# Patient Record
Sex: Male | Born: 1995 | Hispanic: Yes | Marital: Single | Smoking: Never smoker
Health system: Southern US, Community
[De-identification: ages and names within clinical notes are randomized; demographics above are authoritative.]

---

## 2017-10-16 ENCOUNTER — Encounter (HOSPITAL_COMMUNITY): Payer: Self-pay

## 2017-10-16 ENCOUNTER — Emergency Department (HOSPITAL_COMMUNITY)
Admission: EM | Admit: 2017-10-16 | Discharge: 2017-10-16 | Disposition: A | Discharge status: Home / Self Care | Payer: Self-pay | Attending: Emergency Medicine | Admitting: Emergency Medicine

## 2017-10-16 DIAGNOSIS — Z5321 Procedure and treatment not carried out due to patient leaving prior to being seen by health care provider: Secondary | ICD-10-CM | POA: Insufficient documentation

## 2017-10-16 DIAGNOSIS — R42 Dizziness and giddiness: Secondary | ICD-10-CM | POA: Insufficient documentation

## 2017-10-16 LAB — COMPREHENSIVE METABOLIC PANEL
ALK PHOS: 56 U/L (ref 38–126)
ALT: 25 U/L (ref 17–63)
AST: 23 U/L (ref 15–41)
Albumin: 4.4 g/dL (ref 3.5–5.0)
Anion gap: 5 (ref 5–15)
BUN: 16 mg/dL (ref 6–20)
CALCIUM: 9 mg/dL (ref 8.9–10.3)
CHLORIDE: 109 mmol/L (ref 101–111)
CO2: 23 mmol/L (ref 22–32)
CREATININE: 0.77 mg/dL (ref 0.61–1.24)
Glucose, Bld: 92 mg/dL (ref 65–99)
Potassium: 4.1 mmol/L (ref 3.5–5.1)
Sodium: 137 mmol/L (ref 135–145)
Total Bilirubin: 0.6 mg/dL (ref 0.3–1.2)
Total Protein: 7.3 g/dL (ref 6.5–8.1)

## 2017-10-16 LAB — CBC
HCT: 45.2 % (ref 39.0–52.0)
Hemoglobin: 15.8 g/dL (ref 13.0–17.0)
MCH: 30.6 pg (ref 26.0–34.0)
MCHC: 35 g/dL (ref 30.0–36.0)
MCV: 87.6 fL (ref 78.0–100.0)
PLATELETS: 246 10*3/uL (ref 150–400)
RBC: 5.16 MIL/uL (ref 4.22–5.81)
RDW: 12.3 % (ref 11.5–15.5)
WBC: 6.8 10*3/uL (ref 4.0–10.5)

## 2017-10-16 LAB — LIPASE, BLOOD: LIPASE: 33 U/L (ref 11–51)

## 2017-10-16 NOTE — ED Triage Notes (Signed)
Pt complains of right sided pain for three days No vomiting or diarrhea but states that he's constipated

## 2017-10-16 NOTE — ED Notes (Signed)
No answer when called for recheck vitalsigns 

## 2018-04-19 ENCOUNTER — Encounter (HOSPITAL_COMMUNITY): Payer: Self-pay

## 2018-04-19 ENCOUNTER — Other Ambulatory Visit: Payer: Self-pay

## 2018-04-19 ENCOUNTER — Emergency Department (HOSPITAL_COMMUNITY): Payer: Self-pay

## 2018-04-19 DIAGNOSIS — Y93H3 Activity, building and construction: Secondary | ICD-10-CM | POA: Insufficient documentation

## 2018-04-19 DIAGNOSIS — Y929 Unspecified place or not applicable: Secondary | ICD-10-CM | POA: Insufficient documentation

## 2018-04-19 DIAGNOSIS — W450XXA Nail entering through skin, initial encounter: Secondary | ICD-10-CM | POA: Insufficient documentation

## 2018-04-19 DIAGNOSIS — Y999 Unspecified external cause status: Secondary | ICD-10-CM | POA: Insufficient documentation

## 2018-04-19 DIAGNOSIS — Z23 Encounter for immunization: Secondary | ICD-10-CM | POA: Insufficient documentation

## 2018-04-19 DIAGNOSIS — S61442A Puncture wound with foreign body of left hand, initial encounter: Secondary | ICD-10-CM | POA: Insufficient documentation

## 2018-04-19 MED ORDER — TETANUS-DIPHTH-ACELL PERTUSSIS 5-2.5-18.5 LF-MCG/0.5 IM SUSP
0.5000 mL | Freq: Once | INTRAMUSCULAR | Status: AC
  Administered 2018-04-20: 0.5 mL via INTRAMUSCULAR
  Filled 2018-04-19: qty 0.5

## 2018-04-19 NOTE — ED Triage Notes (Signed)
Patient with nail through left hand, +numbness to left hand, PMS intact. Last tetanus unknown.

## 2018-04-19 NOTE — ED Notes (Signed)
Pt tried to pull the nail in his hand out by himself while waiting in the lobby. Pt didn't succeed and blood squirted all over the lobby floor. Pt was only bleeding from the exit hole by the tip of the nail. Bleeding was controlled in lobby with coban and gauze.

## 2018-04-20 ENCOUNTER — Emergency Department (HOSPITAL_COMMUNITY)
Admission: EM | Admit: 2018-04-20 | Discharge: 2018-04-20 | Disposition: A | Discharge status: Home / Self Care | Payer: Self-pay | Attending: Emergency Medicine | Admitting: Emergency Medicine

## 2018-04-20 DIAGNOSIS — M795 Residual foreign body in soft tissue: Secondary | ICD-10-CM

## 2018-04-20 MED ORDER — LIDOCAINE-EPINEPHRINE (PF) 2 %-1:200000 IJ SOLN
10.0000 mL | Freq: Once | INTRAMUSCULAR | Status: AC
  Administered 2018-04-20: 10 mL
  Filled 2018-04-20: qty 20

## 2018-04-20 MED ORDER — CEPHALEXIN 500 MG PO CAPS: 500.0000 mg | ORAL_CAPSULE | Freq: Two times a day (BID) | ORAL | 0 refills | Status: DC

## 2018-04-20 MED ORDER — SULFAMETHOXAZOLE-TRIMETHOPRIM 800-160 MG PO TABS: 1.0000 | ORAL_TABLET | Freq: Two times a day (BID) | ORAL | 0 refills | Status: AC

## 2018-04-20 MED ORDER — BACITRACIN ZINC 500 UNIT/GM EX OINT
1.0000 "application " | TOPICAL_OINTMENT | Freq: Two times a day (BID) | CUTANEOUS | Status: DC
  Administered 2018-04-20: 1 via TOPICAL
  Filled 2018-04-20: qty 1.8

## 2018-04-20 NOTE — ED Provider Notes (Signed)
[facility]-EMERGENCY DEPT Provider Note   CSN: [id number] Arrival date & time: 04/19/18  2100     History   Chief Complaint Chief Complaint  Patient presents with  . Hand Injury  . Foreign Body in Skin    HPI Steven Day is a 22 y.o. male.  Patient presents to the emergency department with a chief complaint of foreign body in skin.  He was working on a remodeling project and hit a nail through the left hand.  Last tetanus shot is unknown.  He complains of moderate pain.  His symptoms are worsened with palpation and movement of the nail.  The history is provided by the patient. No language interpreter was used.    History reviewed. No pertinent past medical history.  There are no active problems to display for this patient.   History reviewed. No pertinent surgical history.      Home Medications    Prior to Admission medications   Not on File    Family History No family history on file.  Social History Social History   Tobacco Use  . Smoking status: Never Smoker  . Smokeless tobacco: Never Used  Substance Use Topics  . Alcohol use: No    Frequency: Never  . Drug use: No     Allergies   Patient has no allergy information on record.   Review of Systems Review of Systems  All other systems reviewed and are negative.    Physical Exam Updated Vital Signs BP (!) 146/101 (BP Location: Right Arm)   Pulse (!) 101   Temp 98 F (36.7 C) (Oral)   Resp 16   Ht 5\' 6"  (1.676 m)   Wt 85.2 kg (187 lb 14.4 oz)   SpO2 100%   BMI 30.33 kg/m   Physical Exam  Constitutional: He is oriented to person, place, and time. He appears well-developed and well-nourished.  HENT:  Head: Normocephalic and atraumatic.  Eyes: Conjunctivae and EOM are normal.  Neck: Normal range of motion.  Cardiovascular: Normal rate.  Pulmonary/Chest: Effort normal.  Abdominal: He exhibits no distension.  Musculoskeletal: Normal range of motion.  ROM and  strength of left thumb is 5/5 isolated at all joints  Neurological: He is alert and oriented to person, place, and time.  Skin: Skin is dry.  1 inch nail through the thenar eminence of the left palm  Psychiatric: He has a normal mood and affect. His behavior is normal. Judgment and thought content normal.  Nursing note and vitals reviewed.    ED Treatments / Results  Labs (all labs ordered are listed, but only abnormal results are displayed) Labs Reviewed - No data to display  EKG None  Radiology Dg Hand Complete Left  Result Date: 04/19/2018 CLINICAL DATA:  Nail and hand. EXAM: LEFT HAND - COMPLETE 3+ VIEW COMPARISON:  None. FINDINGS: Metallic nail with barbs within thenar eminence soft tissues. No fracture deformity or dislocation. No destructive bony lesions. Bandage about the hand. IMPRESSION: Nail within the thenar eminence soft tissues. No acute osseous process. Electronically Signed   By: Awilda Metroourtnay  Bloomer M.D.   On: 04/19/2018 23:23    Procedures .Foreign Body Removal Date/Time: 04/20/2018 1:30 AM Performed by: Roxy HorsemanBrowning, Raye Slyter, PA-C Authorized by: Roxy HorsemanBrowning, Kelsey Edman, PA-C  Consent: Verbal consent obtained. Risks and benefits: risks, benefits and alternatives were discussed Consent given by: patient Patient understanding: patient states understanding of the procedure being performed Patient consent: the patient's understanding of the procedure matches consent given Procedure  consent: procedure consent matches procedure scheduled Relevant documents: relevant documents present and verified Test results: test results available and properly labeled Site marked: the operative site was marked Imaging studies: imaging studies available Required items: required blood products, implants, devices, and special equipment available Patient identity confirmed: verbally with patient Time out: Immediately prior to procedure a "time out" was called to verify the correct patient,  procedure, equipment, support staff and site/side marked as required. Body area: skin General location: upper extremity Location details: left hand  Anesthesia: Local Anesthetic: lidocaine 1% with epinephrine Anesthetic total: 3 mL  Sedation: Patient sedated: no  Patient restrained: no Patient cooperative: yes Localization method: visualized Removal mechanism: hemostat and scalpel Dressing: antibiotic ointment and dressing applied Depth: subcutaneous Complexity: simple 1 objects recovered. Objects recovered: nail Post-procedure assessment: foreign body removed Patient tolerance: Patient tolerated the procedure well with no immediate complications   (including critical care time)  Medications Ordered in ED Medications  Tdap (BOOSTRIX) injection 0.5 mL (has no administration in time range)  lidocaine-EPINEPHrine (XYLOCAINE W/EPI) 2 %-1:200000 (PF) injection 10 mL (has no administration in time range)  bacitracin ointment 1 application (has no administration in time range)     Initial Impression / Assessment and Plan / ED Course  I have reviewed the triage vital signs and the nursing notes.  Pertinent labs & imaging results that were available during my care of the patient were reviewed by me and considered in my medical decision making (see chart for details).     Patient with a 1 inch nail through his left palm.  Removed in the ED.  Tetanus shot updated.  Packing was placed in the puncture wound to allow for draining.  Discharged with antibiotics.  Final Clinical Impressions(s) / ED Diagnoses   Final diagnoses:  Foreign body (FB) in soft tissue    ED Discharge Orders        Ordered    sulfamethoxazole-trimethoprim (BACTRIM DS,SEPTRA DS) 800-160 MG tablet  2 times daily     04/20/18 0132    cephALEXin (KEFLEX) 500 MG capsule  2 times daily     04/20/18 0132       Roxy Horseman, PA-C 04/20/18 0133    Glynn Octave, MD 04/20/18 [id number]

## 2018-05-05 ENCOUNTER — Emergency Department (HOSPITAL_COMMUNITY)
Admission: EM | Admit: 2018-05-05 | Discharge: 2018-05-06 | Payer: Self-pay | Attending: Emergency Medicine | Admitting: Emergency Medicine

## 2018-05-05 ENCOUNTER — Emergency Department (HOSPITAL_COMMUNITY): Payer: Self-pay

## 2018-05-05 DIAGNOSIS — F1510 Other stimulant abuse, uncomplicated: Secondary | ICD-10-CM | POA: Insufficient documentation

## 2018-05-05 LAB — RAPID URINE DRUG SCREEN, HOSP PERFORMED
AMPHETAMINES: POSITIVE — AB
BENZODIAZEPINES: NOT DETECTED
Cocaine: NOT DETECTED
OPIATES: NOT DETECTED
Tetrahydrocannabinol: NOT DETECTED

## 2018-05-05 LAB — COMPREHENSIVE METABOLIC PANEL
ALBUMIN: 4.1 g/dL (ref 3.5–5.0)
ALK PHOS: 60 U/L (ref 38–126)
ALT: 25 U/L (ref 0–44)
ANION GAP: 9 (ref 5–15)
AST: 26 U/L (ref 15–41)
BILIRUBIN TOTAL: 1 mg/dL (ref 0.3–1.2)
BUN: 17 mg/dL (ref 6–20)
CALCIUM: 8.6 mg/dL — AB (ref 8.9–10.3)
CO2: 24 mmol/L (ref 22–32)
Chloride: 106 mmol/L (ref 98–111)
Creatinine, Ser: 0.94 mg/dL (ref 0.61–1.24)
Glucose, Bld: 93 mg/dL (ref 70–99)
Potassium: 3.4 mmol/L — ABNORMAL LOW (ref 3.5–5.1)
Sodium: 139 mmol/L (ref 135–145)
TOTAL PROTEIN: 7 g/dL (ref 6.5–8.1)

## 2018-05-05 LAB — CBG MONITORING, ED: GLUCOSE-CAPILLARY: 86 mg/dL (ref 70–99)

## 2018-05-05 LAB — CBC WITH DIFFERENTIAL/PLATELET
BASOS ABS: 0 10*3/uL (ref 0.0–0.1)
BASOS PCT: 0 %
Eosinophils Absolute: 0.6 10*3/uL (ref 0.0–0.7)
Eosinophils Relative: 10 %
HEMATOCRIT: 42.8 % (ref 39.0–52.0)
Hemoglobin: 15 g/dL (ref 13.0–17.0)
Lymphocytes Relative: 27 %
Lymphs Abs: 1.5 10*3/uL (ref 0.7–4.0)
MCH: 30.1 pg (ref 26.0–34.0)
MCHC: 35 g/dL (ref 30.0–36.0)
MCV: 85.8 fL (ref 78.0–100.0)
Monocytes Absolute: 0.7 10*3/uL (ref 0.1–1.0)
Monocytes Relative: 12 %
Neutro Abs: 2.9 10*3/uL (ref 1.7–7.7)
Neutrophils Relative %: 51 %
Platelets: 219 10*3/uL (ref 150–400)
RBC: 4.99 MIL/uL (ref 4.22–5.81)
RDW: 12.3 % (ref 11.5–15.5)
WBC: 5.7 10*3/uL (ref 4.0–10.5)

## 2018-05-05 LAB — ETHANOL

## 2018-05-05 LAB — I-STAT TROPONIN, ED: TROPONIN I, POC: 0 ng/mL (ref 0.00–0.08)

## 2018-05-05 LAB — CK: Total CK: 416 U/L — ABNORMAL HIGH (ref 49–397)

## 2018-05-05 LAB — ACETAMINOPHEN LEVEL

## 2018-05-05 LAB — I-STAT CG4 LACTIC ACID, ED: Lactic Acid, Venous: 0.91 mmol/L (ref 0.5–1.9)

## 2018-05-05 LAB — SALICYLATE LEVEL: Salicylate Lvl: <7 mg/dL (ref 2.8–30.0)

## 2018-05-05 MED ORDER — SODIUM CHLORIDE 0.9 % IV BOLUS
1000.0000 mL | Freq: Once | INTRAVENOUS | Status: AC
  Administered 2018-05-05: 1000 mL via INTRAVENOUS

## 2018-05-05 NOTE — ED Triage Notes (Signed)
Per ems: Pt in was in back of [employer] car being transported to jail.  Pt became diaphoretic and had near syncope.  When EMS arrived he stated he felt weak. Pt didn't admit to drug use, but drug paraphernalia and meth was found in his bag. 12 lead unremarkable.  After 450 mL of fluid pt states he felt better.

## 2018-05-05 NOTE — ED Provider Notes (Signed)
[facility]-EMERGENCY DEPT Provider Note   CSN: [id number] Arrival date & time: 05/05/18  1851     History   Chief Complaint Chief Complaint  Patient presents with  . Near Syncope    HPI Steven Day is a 22 y.o. male brought in by police for evaluation of near syncope.  Please state that they were called out to his house by patient's mother who stated that he was "acting weird like he was on drugs or something."  Please state that they found patient in the woods dressed in head to toe camo gear and was digging a hole in the ground.  They stated that patient was acting weird and was talking very fast.  They found methamphetamine and a smoking pipe on patient.  They were taking patient back to the house and were deciding if he need to be evaluated by psych.  Police reports that when they first saw him he was very diaphoretic.  They brought him in the house where he was drinking water and seemed to have calm down.  She was placed in police custody and states that when he got in the backseat of the car, he became very diaphoretic and appeared to be near syncopal.  They states patient did not lose consciousness.  Mom states no previous psych history but states that he will intermittently have these bursts where he will start acting funny.  Patient states that he took methamphetamine earlier today.  He denies any cocaine or heroin use.  He states he has not been drinking any alcohol.  Pain reports pain to his arms and legs.  She denies chest pain, abdominal pain, vomiting.  The history is provided by the patient and the police. A language interpreter was used.    No past medical history on file.  There are no active problems to display for this patient.   No past surgical history on file.      Home Medications    Prior to Admission medications   Medication Sig Start Date End Date Taking? Authorizing Provider  cephALEXin (KEFLEX) 500 MG capsule Take 1 capsule (500  mg total) by mouth 2 (two) times daily. 04/20/18   Roxy Horseman, PA-C    Family History No family history on file.  Social History Social History   Tobacco Use  . Smoking status: Never Smoker  . Smokeless tobacco: Never Used  Substance Use Topics  . Alcohol use: No    Frequency: Never  . Drug use: No     Allergies   Patient has no allergy information on record.   Review of Systems Review of Systems  Constitutional: Positive for diaphoresis. Negative for fever.  Respiratory: Negative for cough and shortness of breath.   Cardiovascular: Negative for chest pain.  Gastrointestinal: Negative for abdominal pain, nausea and vomiting.  Musculoskeletal: Positive for myalgias.  Neurological: Negative for headaches.       Near syncope  All other systems reviewed and are negative.    Physical Exam Updated Vital Signs BP 119/73 (BP Location: Left Arm)   Pulse 77   Temp 97.6 F (36.4 C)   Resp 18   SpO2 97%   Physical Exam  Constitutional: He is oriented to person, place, and time. He appears well-developed and well-nourished.  HENT:  Head: Normocephalic and atraumatic.  Mouth/Throat: Oropharynx is clear and moist and mucous membranes are normal.  No tenderness to palpation of skull. No deformities or crepitus noted. No open wounds, abrasions or  lacerations.   Eyes: Pupils are equal, round, and reactive to light. Conjunctivae, EOM and lids are normal.  No mydriasis   Neck: Full passive range of motion without pain.  Cardiovascular: Normal rate, regular rhythm, normal heart sounds and normal pulses. Exam reveals no gallop and no friction rub.  No murmur heard. Pulses:      Radial pulses are 2+ on the right side, and 2+ on the left side.       Dorsalis pedis pulses are 2+ on the right side, and 2+ on the left side.  Pulmonary/Chest: Effort normal and breath sounds normal.  Lungs clear to auscultation bilaterally.  Symmetric chest rise.  No wheezing, rales, rhonchi.    Abdominal: Soft. Normal appearance. There is no tenderness. There is no rigidity and no guarding.  Abdomen is soft, non-distended, non-tender. No rigidity, No guarding. No peritoneal signs.  Musculoskeletal: Normal range of motion.  No tenderness to palpation to bilateral shoulders, clavicles, elbows, and wrists. No deformities or crepitus noted. FROM of BUE without difficulty.  No tenderness to palpation to bilateral knees and ankles. No deformities or crepitus noted. FROM of BLE without any difficulty.   Neurological: He is alert and oriented to person, place, and time.  Alert and oriented x3. Intermittently follows commands, Moves all extremities.  Patient is slow to respond but answers questions appropriately 5/5 strength to BUE and BLE   Skin: Skin is warm and dry. Capillary refill takes less than 2 seconds. He is not diaphoretic. No pallor.  Psychiatric: He has a normal mood and affect. His speech is normal.  Nursing note and vitals reviewed.    ED Treatments / Results  Labs (all labs ordered are listed, but only abnormal results are displayed) Labs Reviewed  COMPREHENSIVE METABOLIC PANEL - Abnormal; Notable for the following components:      Result Value   Potassium 3.4 (*)    Calcium 8.6 (*)    All other components within normal limits  CK - Abnormal; Notable for the following components:   Total CK 416 (*)    All other components within normal limits  RAPID URINE DRUG SCREEN, HOSP PERFORMED - Abnormal; Notable for the following components:   Amphetamines POSITIVE (*)    Barbiturates   (*)    Value: Result not available. Reagent lot number recalled by manufacturer.   All other components within normal limits  ACETAMINOPHEN LEVEL - Abnormal; Notable for the following components:   Acetaminophen (Tylenol), Serum <10 (*)    All other components within normal limits  CBC WITH DIFFERENTIAL/PLATELET  SALICYLATE LEVEL  ETHANOL  I-STAT TROPONIN, ED  I-STAT CG4 LACTIC ACID, ED   CBG MONITORING, ED  I-STAT CG4 LACTIC ACID, ED    EKG EKG Interpretation  Date/Time:  Wednesday May 05 2018 19:22:47 EDT Ventricular Rate:  78 PR Interval:    QRS Duration: 95 QT Interval:  385 QTC Calculation: 439 R Axis:   -3 Text Interpretation:  Age not entered, assumed to be  22 years old for purpose of ECG interpretation Sinus rhythm No previous ECGs available Confirmed by Richardean Canal [phone]) on 05/05/2018 8:34:06 PM   Radiology Ct Head Wo Contrast  Result Date: 05/05/2018 CLINICAL DATA:  Near syncopal episode EXAM: CT HEAD WITHOUT CONTRAST TECHNIQUE: Contiguous axial images were obtained from the base of the skull through the vertex without intravenous contrast. COMPARISON:  None. FINDINGS: Brain: No evidence of acute infarction, hemorrhage, hydrocephalus, extra-axial collection or mass lesion/mass effect. Vascular: No hyperdense  vessel or unexpected calcification. Skull: Normal. Negative for fracture or focal lesion. Sinuses/Orbits: No acute finding. Mild mucosal thickening in the ethmoid sinuses Other: None IMPRESSION: Negative non contrasted CT appearance of the brain Electronically Signed   By: Jasmine PangKim  Fujinaga M.D.   On: 05/05/2018 22:04    Procedures Procedures (including critical care time)  Medications Ordered in ED Medications  sodium chloride 0.9 % bolus 1,000 mL (0 mLs Intravenous Stopped 05/05/18 2055)     Initial Impression / Assessment and Plan / ED Course  I have reviewed the triage vital signs and the nursing notes.  Pertinent labs & imaging results that were available during my care of the patient were reviewed by me and considered in my medical decision making (see chart for details).     22 year old male brought in by police for evaluation of abnormal behavior near syncope.  Initially called out to mom's house for abnormal behavior.  At that time, please found meth and smoking pipe on the patient.  They report he was diaphoretic initially.  When they  brought him into the police car, he again the got diaphoretic and appeared to have a near syncopal event.  No LOC. Patient is afebrile. Patient is slow to respond but answers questions appropriately.  Intermittently will follow commands but has to be prompted.  Using interpreter, patient states that he took meth but denies any other drug use.  Complains of bilateral leg and arm pain.  No chest pain, difficulty breathing, abdominal pain.  Plan to check basic labs, EKG, UDS, salicylate acetaminophen, ethanol.  Will plan to give IV fluids.   I-STAT lactic acid is negative.  Troponin negative.  Ethanol unremarkable.  Salicylate level unremarkable.  Acetaminophen level unremarkable.  CK is 416.  CBC without any significant leukocytosis, anemia.  CMP shows no elevation in BUN or creatinine.  Potassium is 3.4.  Otherwise unremarkable.  UDS is positive for meth.  CT head negative for any acute abnormalities.  Reevaluation.  Patient responds to verbal stimuli.  He only speaks Spanish she has difficulty responding to me but is moving all extremities without any difficulty.  Appears in no acute distress.  Vital signs are stable.  CK was slightly elevated at 416 but not elevated enough to concern for rhabdomyolysis.  Patient received 1 L bolus of fluid here in the ED.  Vital signs are stable.  Patient is being disposed to jail.  Patient stable for discharge at this time.  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

## 2020-12-24 ENCOUNTER — Emergency Department (HOSPITAL_COMMUNITY): Payer: Self-pay

## 2020-12-24 ENCOUNTER — Emergency Department (HOSPITAL_COMMUNITY)
Admission: EM | Admit: 2020-12-24 | Discharge: 2020-12-24 | Disposition: A | Discharge status: Home / Self Care | Payer: Self-pay | Attending: Emergency Medicine | Admitting: Emergency Medicine

## 2020-12-24 ENCOUNTER — Other Ambulatory Visit: Payer: Self-pay

## 2020-12-24 DIAGNOSIS — W1839XA Other fall on same level, initial encounter: Secondary | ICD-10-CM | POA: Insufficient documentation

## 2020-12-24 DIAGNOSIS — S43005A Unspecified dislocation of left shoulder joint, initial encounter: Secondary | ICD-10-CM | POA: Insufficient documentation

## 2020-12-24 DIAGNOSIS — Y92007 Garden or yard of unspecified non-institutional (private) residence as the place of occurrence of the external cause: Secondary | ICD-10-CM | POA: Insufficient documentation

## 2020-12-24 MED ORDER — ONDANSETRON HCL 4 MG/2ML IJ SOLN
4.0000 mg | Freq: Once | INTRAMUSCULAR | Status: AC
  Administered 2020-12-24: 4 mg via INTRAVENOUS
  Filled 2020-12-24: qty 2

## 2020-12-24 MED ORDER — LIDOCAINE HCL 2 % IJ SOLN
10.0000 mL | Freq: Once | INTRAMUSCULAR | Status: AC
  Administered 2020-12-24: 200 mg via INTRADERMAL
  Filled 2020-12-24: qty 20

## 2020-12-24 MED ORDER — FENTANYL CITRATE (PF) 100 MCG/2ML IJ SOLN
50.0000 ug | INTRAMUSCULAR | Status: DC | PRN
  Administered 2020-12-24: 50 ug via NASAL
  Filled 2020-12-24: qty 2

## 2020-12-24 MED ORDER — DIAZEPAM 5 MG/ML IJ SOLN
5.0000 mg | Freq: Once | INTRAMUSCULAR | Status: AC
  Administered 2020-12-24: 5 mg via INTRAVENOUS
  Filled 2020-12-24: qty 2

## 2020-12-24 MED ORDER — HYDROMORPHONE HCL 1 MG/ML IJ SOLN: 1.0000 mg | Freq: Once | INTRAMUSCULAR | Status: DC

## 2020-12-24 MED ORDER — FENTANYL CITRATE (PF) 100 MCG/2ML IJ SOLN
100.0000 ug | Freq: Once | INTRAMUSCULAR | Status: DC
  Administered 2020-12-24: 100 ug via INTRAVENOUS
  Filled 2020-12-24: qty 2

## 2020-12-24 NOTE — ED Triage Notes (Signed)
t arrives to triage in 10/10 pt with left shoulder injury fell in his yard just PTA. Placed in sling, will get xray and inform charge of need for further pain meds in back

## 2020-12-24 NOTE — Progress Notes (Signed)
Orthopedic Tech Progress Note Patient Details:  Steven Day Oct 18, 1996 [id number]  Ortho Devices Type of Ortho Device: Shoulder immobilizer Ortho Device/Splint Location: Left Upper Extremity Ortho Device/Splint Interventions: Ordered,Application,Adjustment   Post Interventions Patient Tolerated: Well Instructions Provided: Adjustment of device,Care of device   Gerald Stabs 12/24/2020, 6:50 PM

## 2020-12-24 NOTE — ED Provider Notes (Addendum)
[facility] EMERGENCY DEPARTMENT Provider Note   CSN: [id number] Arrival date & time: 12/24/20  1713     History Chief Complaint  Patient presents with   Shoulder Injury    Steven Day is a 25 y.o. male presents for evaluation of left shoulder pain after mechanical fall that happened 2 hours prior to arrival.  Patient reports that he was at home and states he fell and thinks he may have landed on his left shoulder.  Since then, he has had pain, difficulty moving.  He states he did not have any other injury.  He denies any numbness/weakness, neck pain.  The history is provided by the patient.       No past medical history on file.  There are no problems to display for this patient.   No past surgical history on file.     No family history on file.  Social History   Tobacco Use   Smoking status: Never Smoker   Smokeless tobacco: Never Used  Substance Use Topics   Alcohol use: No   Drug use: No    Home Medications Prior to Admission medications   Medication Sig Start Date End Date Taking? Authorizing Provider  cephALEXin (KEFLEX) 500 MG capsule Take 1 capsule (500 mg total) by mouth 2 (two) times daily. 04/20/18   Roxy Horseman, PA-C    Allergies    Patient has no known allergies.  Review of Systems   Review of Systems  Musculoskeletal:       Right shoulder pain  Neurological: Negative for weakness and numbness.  All other systems reviewed and are negative.   Physical Exam Updated Vital Signs BP (!) 131/91    Pulse 92    Resp (!) 21    SpO2 95%   Physical Exam Vitals and nursing note reviewed.  Constitutional:      Appearance: He is well-developed and well-nourished.  HENT:     Head: Normocephalic and atraumatic.  Eyes:     General: No scleral icterus.       Right eye: No discharge.        Left eye: No discharge.     Extraocular Movements: EOM normal.     Conjunctiva/sclera: Conjunctivae normal.  Cardiovascular:      Pulses:          Radial pulses are 2+ on the right side and 2+ on the left side.  Pulmonary:     Effort: Pulmonary effort is normal.  Musculoskeletal:     Comments: Tenderness palpation noted to the left shoulder with deformity noted.  Limited range of motion secondary pain.  No bony tenderness of the left elbow, left forearm.  Skin:    General: Skin is warm and dry.     Comments: Good distal cap refill. LUE is not dusky in appearance or cool to touch.  Neurological:     Mental Status: He is alert.  Psychiatric:        Mood and Affect: Mood and affect normal.        Speech: Speech normal.        Behavior: Behavior normal.     ED Results / Procedures / Treatments   Labs (all labs ordered are listed, but only abnormal results are displayed) Labs Reviewed - No data to display  EKG None  Radiology DG Shoulder Left  Result Date: 12/24/2020 CLINICAL DATA:  25 year old male with left shoulder pain. EXAM: LEFT SHOULDER - 2+ VIEW COMPARISON:  None FINDINGS: Evaluation  is limited on this single provided view. There is dislocation of the shoulder. The humeral head appears inferior and likely anterior to the glenoid. Ill-defined irregularity of the humeral head may represent a Hill-Sachs injury. The soft tissues are unremarkable. IMPRESSION: Dislocated left shoulder. Electronically Signed   By: Elgie Collard M.D.   On: 12/24/2020 18:47   DG Shoulder Left Port  Result Date: 12/24/2020 CLINICAL DATA:  Status post reduction for anterior dislocation EXAM: LEFT SHOULDER COMPARISON:  December 24, 2020 study obtained earlier in the day FINDINGS: Frontal and oblique views obtained. There has been successful reduction of anterior dislocation. There is an apparent impaction type injury along the lateral humeral head. No other evident fracture. Joint spaces appear normal. Visualized left lung clear. IMPRESSION: Successful reduction of previous anterior dislocation. Apparent impaction type injury along  the lateral humeral head. No other evident fracture. No evident arthropathy. Electronically Signed   By: Bretta Bang III M.D.   On: 12/24/2020 19:30    Procedures Reduction of dislocation  Date/Time: 12/24/2020 8:01 PM Performed by: Maxwell Caul, PA-C Authorized by: Maxwell Caul, PA-C  Consent: Verbal consent obtained. Risks and benefits: risks, benefits and alternatives were discussed Consent given by: patient Patient understanding: patient states understanding of the procedure being performed Patient consent: the patient's understanding of the procedure matches consent given Procedure consent: procedure consent matches procedure scheduled Relevant documents: relevant documents present and verified Test results: test results available and properly labeled Site marked: the operative site was marked Required items: required blood products, implants, devices, and special equipment available Patient identity confirmed: verbally with patient Time out: Immediately prior to procedure a "time out" was called to verify the correct patient, procedure, equipment, support staff and site/side marked as required. Local anesthesia used: no  Anesthesia: Local anesthesia used: no  Sedation: Patient sedated: no  Patient tolerance: patient tolerated the procedure well with no immediate complications      Medications Ordered in ED Medications  fentaNYL (SUBLIMAZE) injection 50 mcg (50 mcg Nasal Given 12/24/20 1750)  ondansetron (ZOFRAN) injection 4 mg (4 mg Intravenous Given 12/24/20 1823)  lidocaine (XYLOCAINE) 2 % (with pres) injection 200 mg (200 mg Intradermal Given by Other 12/24/20 1840)  diazepam (VALIUM) injection 5 mg (5 mg Intravenous Given 12/24/20 1840)    ED Course  I have reviewed the triage vital signs and the nursing notes.  Pertinent labs & imaging results that were available during my care of the patient were reviewed by me and considered in my medical decision  making (see chart for details).    MDM Rules/Calculators/A&P                          25 y.o. M who presents for evaluation of left shoulder pain after a mechanical fall that occurred 2 hours prior to arrival. Patient is afebrile, non-toxic appearing.  He appears uncomfortable, in pain.  Vitals show that he is slightly tachycardic, hypertensive.  Likely secondary to pain.  On exam, right shoulder appears to be deformed, concern for dislocation versus fracture.  He is neurovascularly intact.  X-rays ordered at triage.  X-ray shows shoulder dislocation.  Reduction done at bedside with successful reduction.  Repeat x-ray shows that shoulder is back in place.  Patient placed in a shoulder immobilizer.  I discussed with patient with the Spanish interpreter.  Instructed to keep the shoulder immobilizer on until he follows up with orthopedics.  Will give outpatient orthopedics  for follow-up. At this time, patient exhibits no emergent life-threatening condition that require further evaluation in ED. Patient had ample opportunity for questions and discussion. All patient's questions were answered with full understanding. Strict return precautions discussed. Patient expresses understanding and agreement to plan.   Portions of this note were generated with Scientist, clinical (histocompatibility and immunogenetics). Dictation errors may occur despite best attempts at proofreading.   Final Clinical Impression(s) / ED Diagnoses Final diagnoses:  Dislocation of left shoulder joint, initial encounter    Rx / DC Orders ED Discharge Orders    None       Rosana Hoes 12/24/20 2003    Rosana Hoes 12/25/20 1509    Charlynne Pander, MD 12/25/20 [phone]

## 2020-12-24 NOTE — ED Notes (Signed)
Shoulder immobilizer in place at time of discharge.

## 2020-12-24 NOTE — Discharge Instructions (Signed)
Wear the sling immobilizer until you follow up with ortho.   You can take Tylenol or Ibuprofen as directed for pain. You can alternate Tylenol and Ibuprofen every 4 hours. If you take Tylenol at 1pm, then you can take Ibuprofen at 5pm. Then you can take Tylenol again at 9pm.   Call ortho and arrange for an appointment.   Return to the Emergency Dept for any worsening pain, numbness/weakness or any other worsening or concerning symptoms.

## 2020-12-24 NOTE — Medical Student Note (Signed)
[facility]-EMERGENCY DEPT Provider Student Note For educational purposes for Medical, PA and NP students only and not part of the legal medical record.   CSN: [id number] Arrival date & time: 12/24/20  1713      History   Chief Complaint Chief Complaint  Patient presents with  . Shoulder Injury    HPI Steven Day is a 25 y.o. male who presents to ED with acute left shoulder dislocation.   States that about two hours PTA he was "playing around the house" when he fell and landed on his left shoulder. He immediately had pain and deformity and presented to the ED with significant other. He states he is in 10/10 pain. Denies hitting head, LOC or any other pain.   No past medical history on file.  There are no problems to display for this patient.   No past surgical history on file.   Home Medications    Prior to Admission medications   Medication Sig Start Date End Date Taking? Authorizing Provider  cephALEXin (KEFLEX) 500 MG capsule Take 1 capsule (500 mg total) by mouth 2 (two) times daily. 04/20/18   Roxy Horseman, PA-C    Family History No family history on file.  Social History Social History   Tobacco Use  . Smoking status: Never Smoker  . Smokeless tobacco: Never Used  Substance Use Topics  . Alcohol use: No  . Drug use: No     Allergies   Patient has no known allergies.   Review of Systems Review of Systems  Constitutional: Positive for diaphoresis.  HENT: Negative.   Eyes: Negative.   Respiratory: Negative.   Cardiovascular: Negative.   Gastrointestinal: Negative.   Endocrine: Negative.   Genitourinary: Negative.   Musculoskeletal: Positive for arthralgias.  Skin: Negative.   Allergic/Immunologic: Negative.   Neurological: Negative.   Hematological: Negative.   Psychiatric/Behavioral: Negative.      Physical Exam Updated Vital Signs BP (!) 137/98   Pulse 90   Resp 13   SpO2 94%   Physical Exam Vitals and nursing note reviewed.   Constitutional:      General: He is in acute distress.     Appearance: He is diaphoretic.  HENT:     Head: Normocephalic and atraumatic.  Cardiovascular:     Pulses: Normal pulses.  Musculoskeletal:        General: Tenderness, deformity and signs of injury present.     Right shoulder: Normal.     Left shoulder: Deformity, tenderness and bony tenderness present. Decreased range of motion.     Cervical back: Normal range of motion and neck supple. No tenderness.  Skin:    General: Skin is warm.     Capillary Refill: Capillary refill takes less than 2 seconds.     Findings: No bruising.  Neurological:     General: No focal deficit present.     Mental Status: He is alert.     Sensory: No sensory deficit.    ED Treatments / Results  Labs (all labs ordered are listed, but only abnormal results are displayed) Labs Reviewed - No data to display  EKG  Radiology DG Shoulder Left  Result Date: 12/24/2020 CLINICAL DATA:  24 year old male with left shoulder pain. EXAM: LEFT SHOULDER - 2+ VIEW COMPARISON:  None FINDINGS: Evaluation is limited on this single provided view. There is dislocation of the shoulder. The humeral head appears inferior and likely anterior to the glenoid. Ill-defined irregularity of the humeral head may represent a Hill-Sachs  injury. The soft tissues are unremarkable. IMPRESSION: Dislocated left shoulder. Electronically Signed   By: Elgie Collard M.D.   On: 12/24/2020 18:47    Procedures Procedures (including critical care time)  Medications Ordered in ED Medications  fentaNYL (SUBLIMAZE) injection 50 mcg (50 mcg Nasal Given 12/24/20 1750)  ondansetron (ZOFRAN) injection 4 mg (4 mg Intravenous Given 12/24/20 1823)  lidocaine (XYLOCAINE) 2 % (with pres) injection 200 mg (200 mg Intradermal Given by Other 12/24/20 1840)  diazepam (VALIUM) injection 5 mg (5 mg Intravenous Given 12/24/20 1840)   Initial Impression / Assessment and Plan / ED Course  I have reviewed  the triage vital signs and the nursing notes.  Pertinent labs & imaging results that were available during my care of the patient were reviewed by me and considered in my medical decision making (see chart for details).  Steven Day is a 25yoM with HPI as listed above. Presented for anterior dislocation of left shoulder, XR confirmed. He is HDS. Radial pulse 2+, sensation intact.   He was given fentanyl and 5mg  IV valium prior to reduction. Shoulder was manually reduced using downward pressure and external rotation by Dr. . Patient tolerated the procedure well. Repeat XR confirms humeral head is in place. Continues to be vascular and neurologically intact. Placed in shoulder sling immediately after procedure.   Final Clinical Impressions(s) / ED Diagnoses   Final diagnoses:  Dislocation of left shoulder joint, initial encounter    New Prescriptions New Prescriptions   No medications on file

## 2021-04-02 ENCOUNTER — Ambulatory Visit (HOSPITAL_COMMUNITY)
Admission: EM | Admit: 2021-04-02 | Discharge: 2021-04-02 | Disposition: A | Discharge status: Home / Self Care | Payer: Self-pay | Attending: Physician Assistant | Admitting: Physician Assistant

## 2021-04-02 ENCOUNTER — Encounter (HOSPITAL_COMMUNITY): Payer: Self-pay | Admitting: *Deleted

## 2021-04-02 ENCOUNTER — Ambulatory Visit (INDEPENDENT_AMBULATORY_CARE_PROVIDER_SITE_OTHER): Payer: Self-pay

## 2021-04-02 ENCOUNTER — Other Ambulatory Visit: Payer: Self-pay

## 2021-04-02 DIAGNOSIS — M79601 Pain in right arm: Secondary | ICD-10-CM

## 2021-04-02 DIAGNOSIS — M79631 Pain in right forearm: Secondary | ICD-10-CM

## 2021-04-02 MED ORDER — NAPROXEN 500 MG PO TABS: 500.0000 mg | ORAL_TABLET | Freq: Two times a day (BID) | ORAL | 0 refills | Status: AC

## 2021-04-02 NOTE — Discharge Instructions (Signed)
Take Naprosyn twice a day to help with pain.  You should not take additional NSAIDs including aspirin, ibuprofen/Advil, naproxen/Aleve with this medication due to risk of GI bleeding.  Keep area wrapped and avoid use.  Follow-up with sports medicine should symptoms persist.  If you have any worsening symptoms including spread of erythema or swelling you need to go to the emergency room as we discussed to rule out a blood clot.

## 2021-04-02 NOTE — ED Provider Notes (Signed)
[facility]    CSN: [id number] Arrival date & time: 04/02/21  1421      History   Chief Complaint Chief Complaint  Patient presents with  . Arm Injury    HPI Steven Day is a 25 y.o. male.   Patient presents today accompanied by family friend who provided translation after declining video interpreter.  Reports a 2-day history of erythema and swelling of right forearm.  Denies any injury or increase in activity prior to symptom onset.  Pain is rated 7 on a 0-10 pain scale, localized to affected area, worse with palpation, no alleviating factors identified.  He has tried Tylenol without improvement of symptoms.  He is right-handed.  He denies any numbness, tingling, decreased range of motion.  He denies previous injury or surgery in this area.  He is having difficulty with daily activities as result of symptoms.     History reviewed. No pertinent past medical history.  There are no problems to display for this patient.   History reviewed. No pertinent surgical history.     Home Medications    Prior to Admission medications   Medication Sig Start Date End Date Taking? Authorizing Provider  naproxen (NAPROSYN) 500 MG tablet Take 1 tablet (500 mg total) by mouth 2 (two) times daily. 04/02/21  Yes Rickelle Sylvestre, Noberto Retort, PA-C    Family History Family History  Family history unknown: Yes    Social History Social History   Tobacco Use  . Smoking status: Never Smoker  . Smokeless tobacco: Never Used  Substance Use Topics  . Alcohol use: No  . Drug use: No     Allergies   Patient has no known allergies.   Review of Systems Review of Systems  Constitutional: Positive for activity change. Negative for appetite change, fatigue and fever.  Respiratory: Negative for cough and shortness of breath.   Cardiovascular: Negative for chest pain.  Gastrointestinal: Negative for abdominal pain, diarrhea, nausea and vomiting.  Musculoskeletal: Positive for  arthralgias. Negative for myalgias.  Neurological: Negative for dizziness, light-headedness and headaches.     Physical Exam Triage Vital Signs ED Triage Vitals  Enc Vitals Group     BP 04/02/21 1611 129/87     Pulse Rate 04/02/21 1611 82     Resp 04/02/21 1611 18     Temp 04/02/21 1611 98.1 F (36.7 C)     Temp src --      SpO2 04/02/21 1611 97 %     Weight --      Height --      Head Circumference --      Peak Flow --      Pain Score 04/02/21 1607 8     Pain Loc --      Pain Edu? --      Excl. in GC? --    No data found.  Updated Vital Signs BP 129/87   Pulse 82   Temp 98.1 F (36.7 C)   Resp 18   SpO2 97%   Visual Acuity Right Eye Distance:   Left Eye Distance:   Bilateral Distance:    Right Eye Near:   Left Eye Near:    Bilateral Near:     Physical Exam Vitals reviewed.  Constitutional:      General: He is awake.     Appearance: Normal appearance. He is normal weight. He is not ill-appearing.     Comments: Very pleasant male appears stated age in no acute distress  HENT:     Head: Normocephalic and atraumatic.  Cardiovascular:     Rate and Rhythm: Normal rate and regular rhythm.     Pulses:          Radial pulses are 2+ on the right side and 2+ on the left side.     Heart sounds: Normal heart sounds. No murmur heard.     Comments: Capillary refill within 2 seconds. Pulmonary:     Effort: Pulmonary effort is normal.     Breath sounds: Normal breath sounds. No stridor. No wheezing, rhonchi or rales.     Comments: Clear to auscultation bilaterally Abdominal:     General: Bowel sounds are normal.     Palpations: Abdomen is soft.     Tenderness: There is no abdominal tenderness.  Musculoskeletal:     Right forearm: Deformity, tenderness and bony tenderness present.       Arms:     Comments: Erythema and swelling noted dorsal right forearm over radius.  Normal active range of motion at wrist and hand.  Hand neurovascularly intact.  Neurological:      Mental Status: He is alert.  Psychiatric:        Behavior: Behavior is cooperative.      UC Treatments / Results  Labs (all labs ordered are listed, but only abnormal results are displayed) Labs Reviewed - No data to display  EKG   Radiology DG Forearm Right  Result Date: 04/02/2021 CLINICAL DATA:  Right forearm deformity and pain and skin erythema, no reported injury EXAM: RIGHT FOREARM - 2 VIEW COMPARISON:  None. FINDINGS: No fracture. No focal osseous lesions. No dislocation at the elbow or wrist. No periosteal reaction or bony erosions. No radiopaque foreign bodies. Mild soft tissue swelling in dorsal mid forearm. IMPRESSION: Mild soft tissue swelling in the dorsal mid forearm. No acute osseous abnormality. No radiopaque foreign bodies. Electronically Signed   By: Delbert Phenix M.D.   On: 04/02/2021 17:42    Procedures Procedures (including critical care time)  Medications Ordered in UC Medications - No data to display  Initial Impression / Assessment and Plan / UC Course  I have reviewed the triage vital signs and the nursing notes.  Pertinent labs & imaging results that were available during my care of the patient were reviewed by me and considered in my medical decision making (see chart for details).     X-ray showed no bony abnormality.  Unclear etiology of symptoms.  We will start NSAID and patient was prescribed Naprosyn 500 mg be taken up to twice a day.  He was instructed not take additional NSAIDs with this medication due to risk of GI bleeding.  Encouraged him to use ice and wrap for additional symptom relief.  He was provided contact information for orthopedic provider should symptoms persist.  Low suspicion for superficial phlebitis as patient does not have risk factors but discussed that if erythema/swelling worsens he needs to go to the emergency room.  Discussed alarm symptoms that warrant emergent evaluation.  Strict return precautions given to which patient  expressed understanding.  Final Clinical Impressions(s) / UC Diagnoses   Final diagnoses:  Right arm pain     Discharge Instructions     Take Naprosyn twice a day to help with pain.  You should not take additional NSAIDs including aspirin, ibuprofen/Advil, naproxen/Aleve with this medication due to risk of GI bleeding.  Keep area wrapped and avoid use.  Follow-up with sports medicine should symptoms persist.  If you have any worsening symptoms including spread of erythema or swelling you need to go to the emergency room as we discussed to rule out a blood clot.    ED Prescriptions    Medication Sig Dispense Auth. Provider   naproxen (NAPROSYN) 500 MG tablet Take 1 tablet (500 mg total) by mouth 2 (two) times daily. 30 tablet Brevyn Ring, Noberto Retort, PA-C     PDMP not reviewed this encounter.   Jeani Hawking, PA-C 04/02/21 1806

## 2021-04-02 NOTE — ED Triage Notes (Signed)
Pt reports he was not injured but has redness to skin on rt posterior fore arm.

## 2021-10-10 IMAGING — CR DG SHOULDER 2+V*L*
2 series · 2 of 2 positions shown · non-contrast
Comparison: None

CLINICAL DATA: 25-year-old male with left shoulder pain.

EXAM:
LEFT SHOULDER - 2+ VIEW

[shoulder grashey]
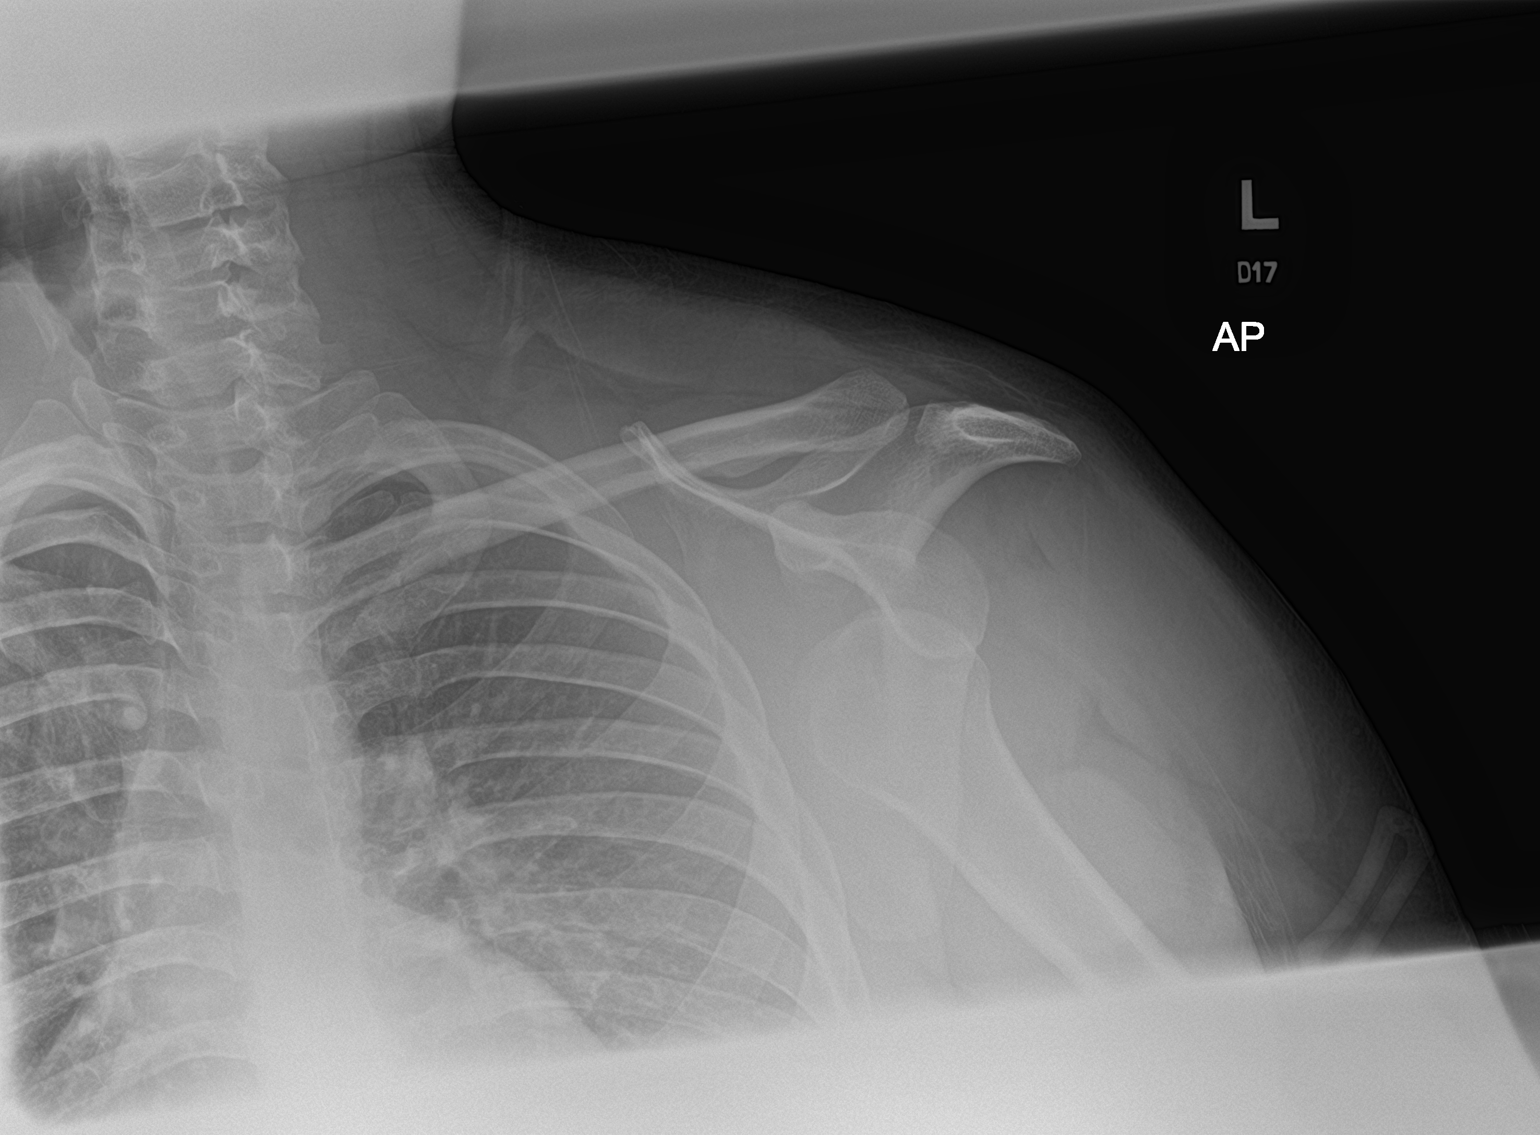

[shoulder ap neutral]
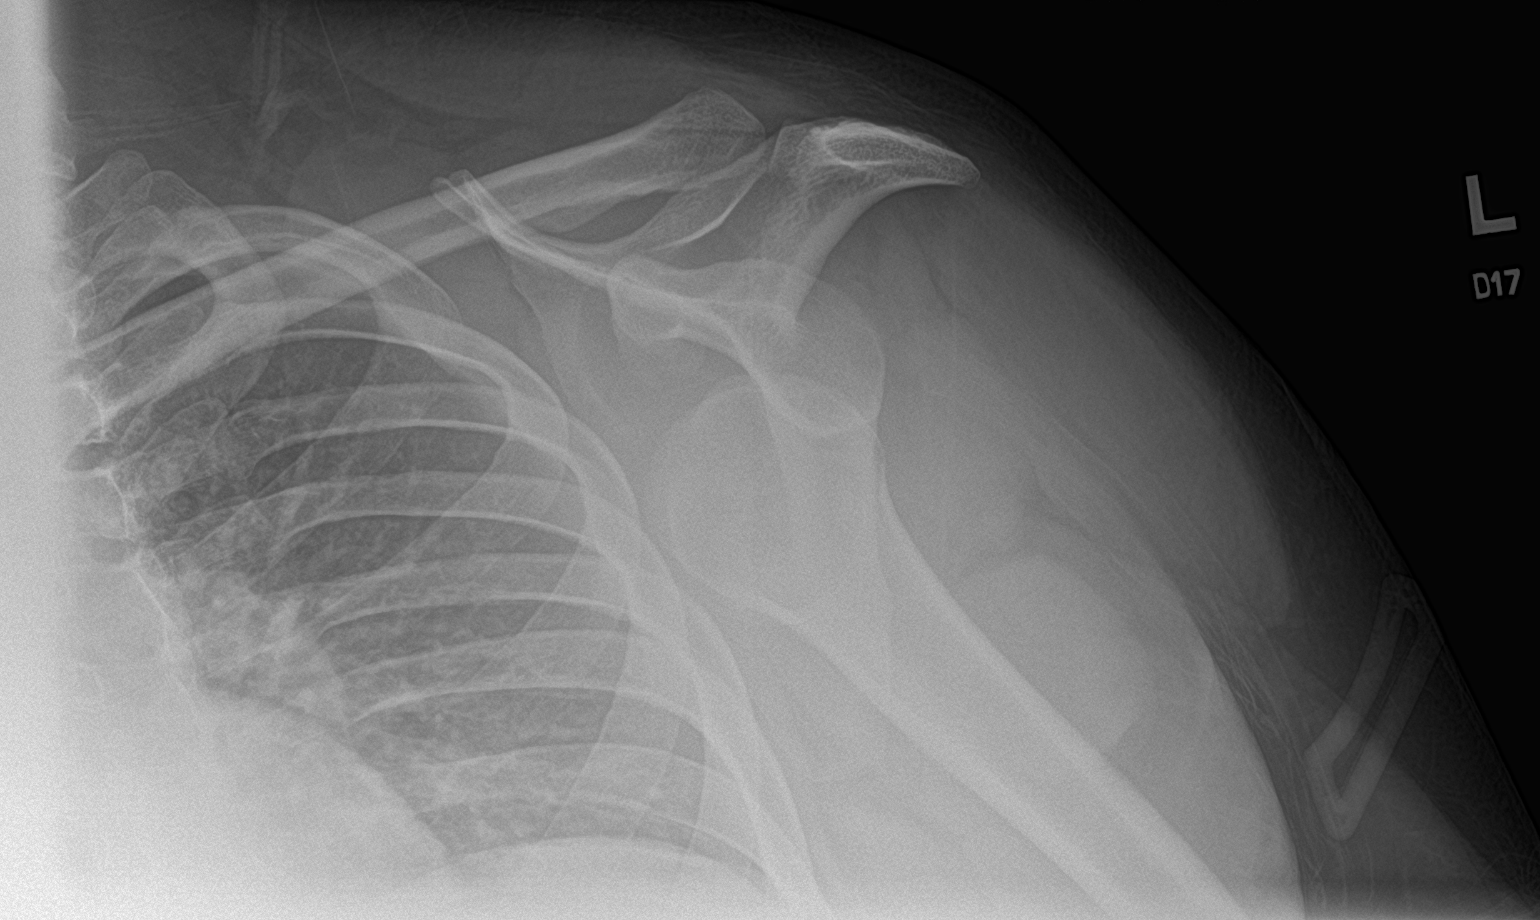

[2 of 2 positions shown; findings below may reference images not displayed]

FINDINGS: Evaluation is limited on this single provided view.

There is dislocation of the shoulder. The humeral head appears
inferior and likely anterior to the glenoid. Ill-defined
irregularity of the humeral head may represent a Hill-Sachs injury.
The soft tissues are unremarkable.
IMPRESSION: Dislocated left shoulder.

## 2021-10-10 IMAGING — DX DG SHOULDER 1V*L*
2 series · 2 of 2 positions shown · non-contrast
Comparison: December 24, 2020 study obtained earlier in the day

CLINICAL DATA: Status post reduction for anterior dislocation

EXAM:
LEFT SHOULDER

[shoulder ap]
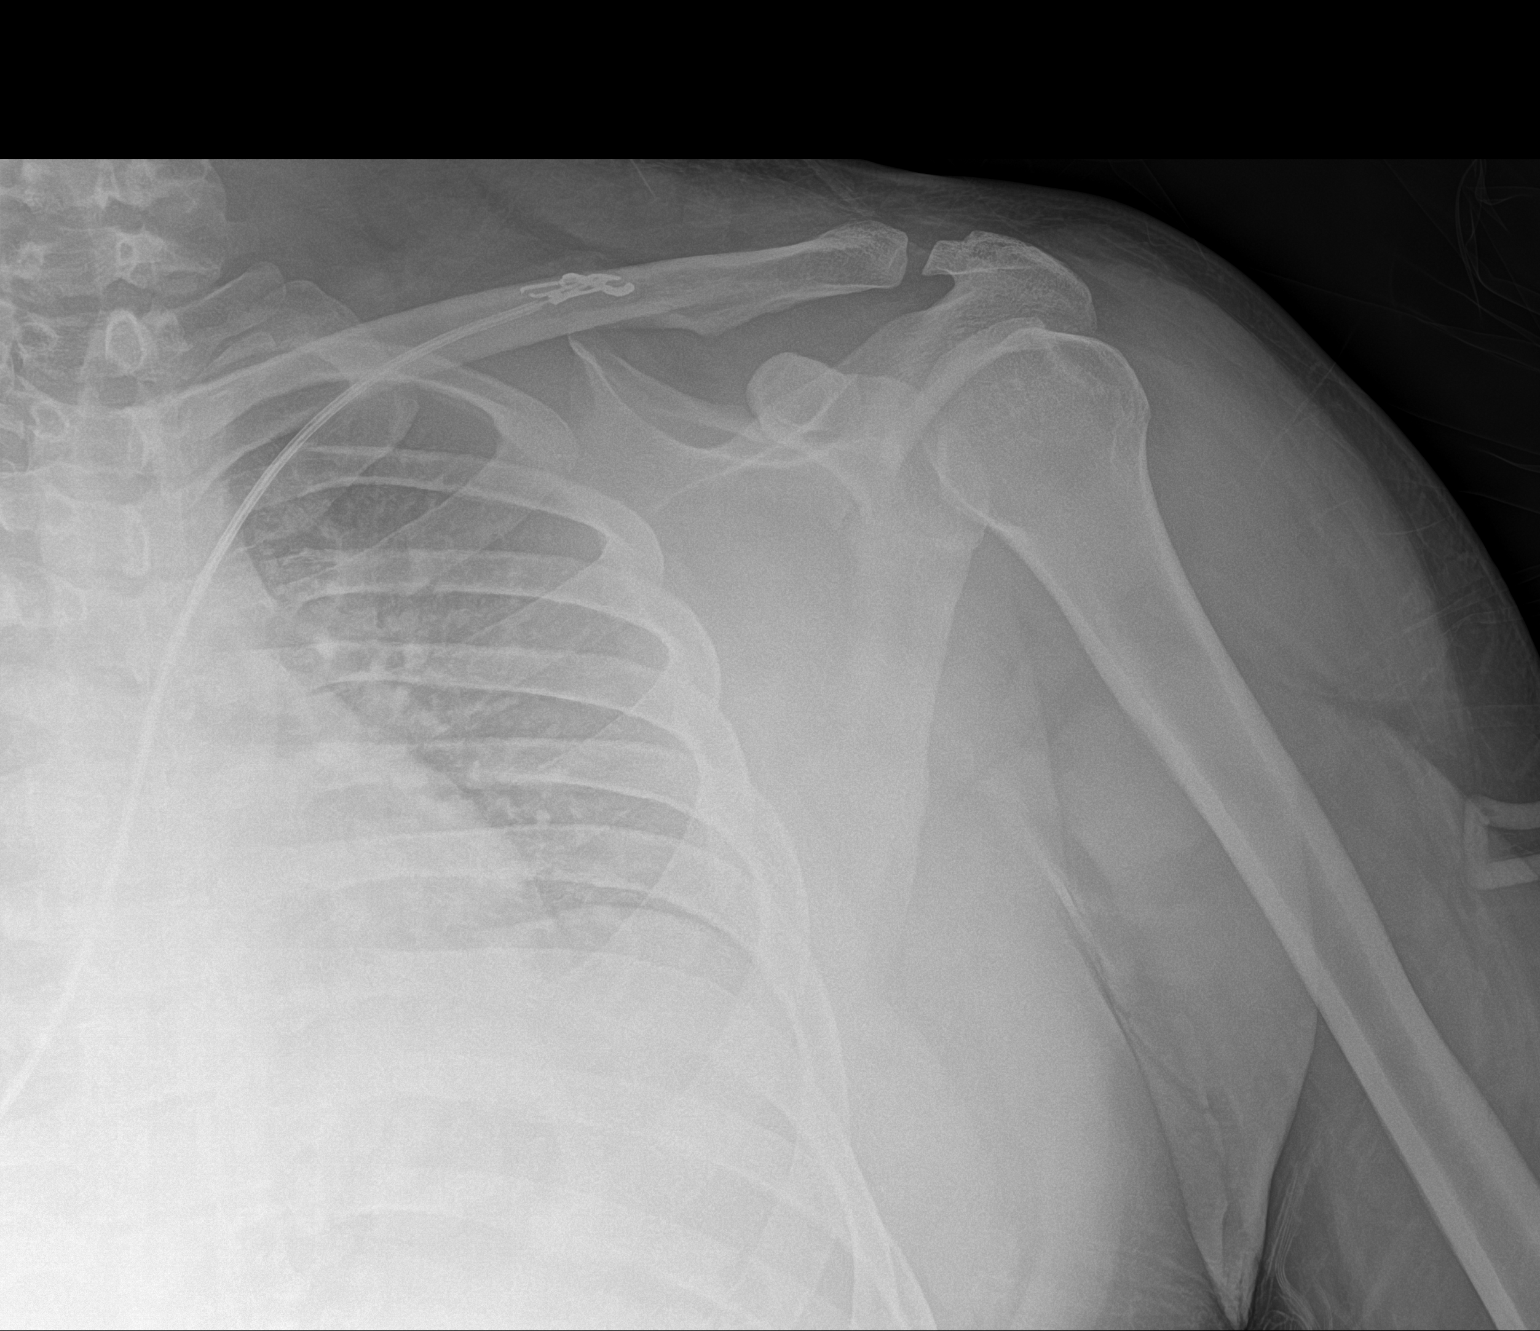

[shoulder obl]
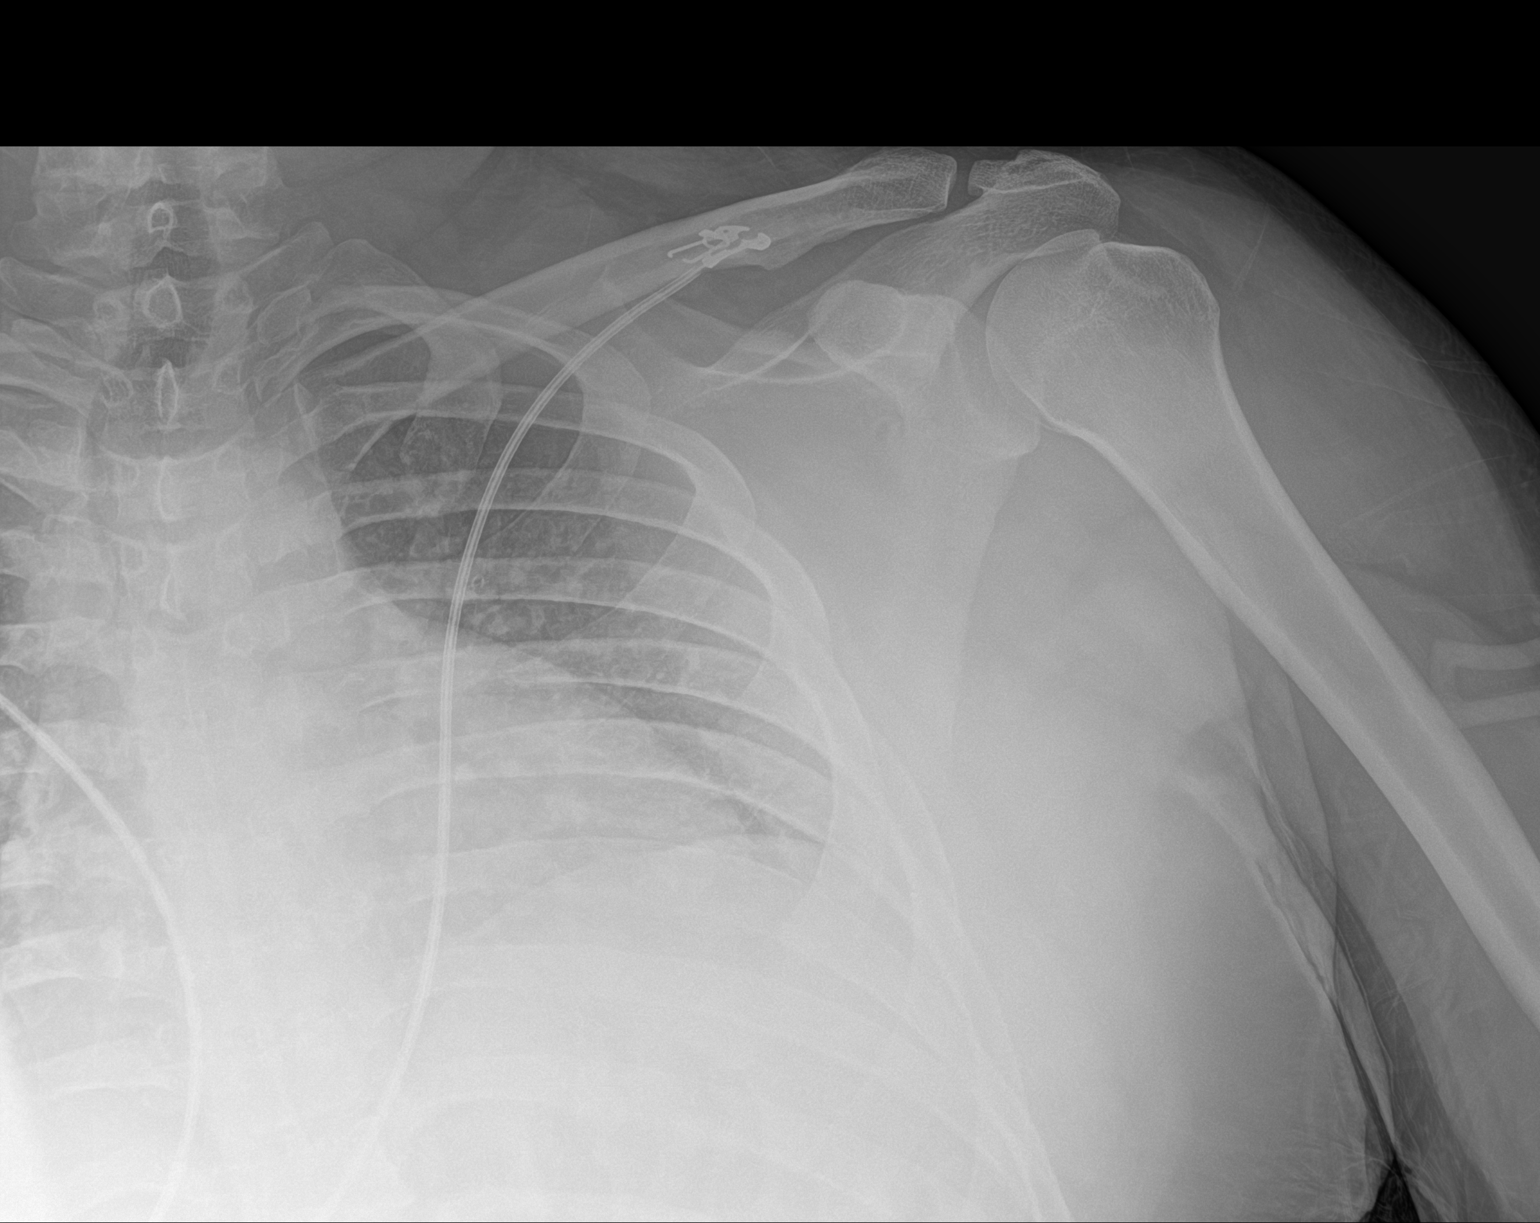

[2 of 2 positions shown; findings below may reference images not displayed]

FINDINGS: Frontal and oblique views obtained. There has been successful
reduction of anterior dislocation. There is an apparent impaction
type injury along the lateral humeral head. No other evident
fracture. Joint spaces appear normal. Visualized left lung clear.
IMPRESSION: Successful reduction of previous anterior dislocation. Apparent
impaction type injury along the lateral humeral head. No other
evident fracture. No evident arthropathy.
